# Patient Record
Sex: Female | Born: 1955 | Race: White | Hispanic: No | Marital: Married | Smoking: Never smoker
Health system: Southern US, Community
[De-identification: ages and names within clinical notes are randomized; demographics above are authoritative.]

## PROBLEM LIST (undated history)

## (undated) DIAGNOSIS — C801 Malignant (primary) neoplasm, unspecified: Secondary | ICD-10-CM

## (undated) DIAGNOSIS — C50919 Malignant neoplasm of unspecified site of unspecified female breast: Secondary | ICD-10-CM

---

## 2000-03-13 DIAGNOSIS — Z9221 Personal history of antineoplastic chemotherapy: Secondary | ICD-10-CM

## 2000-03-13 DIAGNOSIS — C50919 Malignant neoplasm of unspecified site of unspecified female breast: Secondary | ICD-10-CM

## 2000-03-13 HISTORY — DX: Malignant neoplasm of unspecified site of unspecified female breast: C50.919

## 2000-03-13 HISTORY — DX: Personal history of antineoplastic chemotherapy: Z92.21

## 2000-12-11 HISTORY — PX: BREAST BIOPSY: SHX20

## 2001-03-13 DIAGNOSIS — Z923 Personal history of irradiation: Secondary | ICD-10-CM

## 2001-03-13 HISTORY — DX: Personal history of irradiation: Z92.3

## 2001-07-09 HISTORY — PX: BREAST EXCISIONAL BIOPSY: SUR124

## 2004-09-19 ENCOUNTER — Ambulatory Visit: Payer: Self-pay | Admitting: Oncology

## 2004-11-16 ENCOUNTER — Ambulatory Visit: Payer: Self-pay | Admitting: General Surgery

## 2005-11-20 ENCOUNTER — Ambulatory Visit: Payer: Self-pay | Admitting: General Surgery

## 2006-11-21 ENCOUNTER — Ambulatory Visit: Payer: Self-pay | Admitting: General Surgery

## 2007-11-20 ENCOUNTER — Ambulatory Visit: Payer: Self-pay | Admitting: General Surgery

## 2008-11-24 ENCOUNTER — Ambulatory Visit: Payer: Self-pay | Admitting: General Surgery

## 2009-11-25 ENCOUNTER — Ambulatory Visit: Payer: Self-pay | Admitting: General Surgery

## 2010-11-29 ENCOUNTER — Ambulatory Visit: Payer: Self-pay | Admitting: General Surgery

## 2011-03-13 ENCOUNTER — Ambulatory Visit: Payer: Self-pay | Admitting: General Surgery

## 2011-12-04 ENCOUNTER — Ambulatory Visit: Payer: Self-pay | Admitting: Family Medicine

## 2012-12-25 ENCOUNTER — Ambulatory Visit: Payer: Self-pay | Admitting: Family Medicine

## 2013-12-30 ENCOUNTER — Ambulatory Visit: Payer: Self-pay | Admitting: Family Medicine

## 2014-10-06 ENCOUNTER — Other Ambulatory Visit: Payer: Self-pay | Admitting: Obstetrics and Gynecology

## 2014-10-06 DIAGNOSIS — Z13820 Encounter for screening for osteoporosis: Secondary | ICD-10-CM

## 2014-10-06 DIAGNOSIS — Z1231 Encounter for screening mammogram for malignant neoplasm of breast: Secondary | ICD-10-CM

## 2015-01-04 ENCOUNTER — Ambulatory Visit
Admission: RE | Admit: 2015-01-04 | Discharge: 2015-01-04 | Disposition: A | Discharge status: Home / Self Care | Payer: PRIVATE HEALTH INSURANCE | Source: Ambulatory Visit | Attending: Obstetrics and Gynecology | Admitting: Obstetrics and Gynecology

## 2015-01-04 ENCOUNTER — Ambulatory Visit: Payer: Self-pay

## 2015-01-04 DIAGNOSIS — Z13820 Encounter for screening for osteoporosis: Secondary | ICD-10-CM | POA: Diagnosis present

## 2015-01-04 DIAGNOSIS — M8588 Other specified disorders of bone density and structure, other site: Secondary | ICD-10-CM | POA: Insufficient documentation

## 2015-01-04 DIAGNOSIS — M85852 Other specified disorders of bone density and structure, left thigh: Secondary | ICD-10-CM | POA: Diagnosis not present

## 2015-01-04 DIAGNOSIS — Z1231 Encounter for screening mammogram for malignant neoplasm of breast: Secondary | ICD-10-CM | POA: Insufficient documentation

## 2015-01-08 ENCOUNTER — Encounter: Payer: Self-pay | Admitting: Family

## 2015-01-08 ENCOUNTER — Ambulatory Visit: Payer: Self-pay | Admitting: Family

## 2015-01-08 VITALS — BP 110/60 | HR 82 | Temp 99.0°F

## 2015-01-08 DIAGNOSIS — J069 Acute upper respiratory infection, unspecified: Secondary | ICD-10-CM

## 2015-01-08 DIAGNOSIS — Z853 Personal history of malignant neoplasm of breast: Secondary | ICD-10-CM

## 2015-01-08 MED ORDER — AZITHROMYCIN 250 MG PO TABS: ORAL_TABLET | ORAL | Status: DC

## 2015-01-08 NOTE — Progress Notes (Signed)
S/ 2 day h/o cold sxs , generalised, low grade temp  Denied gi gu sx's  O/ low grade temp noted mildly ill ENT  Viral changes Neck supple heart rsr lungs clear  A/ URI P / viral course discussed and supportive measures encouraged.  Zpack to have on hand if sxs persist or localise.

## 2015-02-19 ENCOUNTER — Ambulatory Visit: Payer: PRIVATE HEALTH INSURANCE | Admitting: Anesthesiology

## 2015-02-19 ENCOUNTER — Encounter: Payer: Self-pay | Admitting: *Deleted

## 2015-02-19 ENCOUNTER — Ambulatory Visit
Admission: RE | Admit: 2015-02-19 | Discharge: 2015-02-19 | Disposition: A | Discharge status: Home / Self Care | Payer: PRIVATE HEALTH INSURANCE | Source: Ambulatory Visit | Attending: Gastroenterology | Admitting: Gastroenterology

## 2015-02-19 ENCOUNTER — Encounter
Admission: RE | Disposition: A | Discharge status: Home / Self Care | Payer: Self-pay | Source: Ambulatory Visit | Attending: Gastroenterology

## 2015-02-19 DIAGNOSIS — Z8371 Family history of colonic polyps: Secondary | ICD-10-CM | POA: Insufficient documentation

## 2015-02-19 DIAGNOSIS — Z9221 Personal history of antineoplastic chemotherapy: Secondary | ICD-10-CM | POA: Insufficient documentation

## 2015-02-19 DIAGNOSIS — Z1211 Encounter for screening for malignant neoplasm of colon: Secondary | ICD-10-CM | POA: Insufficient documentation

## 2015-02-19 DIAGNOSIS — Z923 Personal history of irradiation: Secondary | ICD-10-CM | POA: Diagnosis not present

## 2015-02-19 DIAGNOSIS — Z853 Personal history of malignant neoplasm of breast: Secondary | ICD-10-CM | POA: Diagnosis not present

## 2015-02-19 DIAGNOSIS — Z803 Family history of malignant neoplasm of breast: Secondary | ICD-10-CM | POA: Diagnosis not present

## 2015-02-19 HISTORY — PX: COLONOSCOPY WITH PROPOFOL: SHX5780

## 2015-02-19 HISTORY — DX: Malignant (primary) neoplasm, unspecified: C80.1

## 2015-02-19 SURGERY — COLONOSCOPY WITH PROPOFOL
Anesthesia: General

## 2015-02-19 MED ORDER — EPHEDRINE SULFATE 50 MG/ML IJ SOLN
INTRAMUSCULAR | Status: DC | PRN
  Administered 2015-02-19: 10 mg via INTRAVENOUS

## 2015-02-19 MED ORDER — LIDOCAINE HCL (CARDIAC) 20 MG/ML IV SOLN
INTRAVENOUS | Status: DC | PRN
  Administered 2015-02-19: 80 mg via INTRAVENOUS

## 2015-02-19 MED ORDER — PROPOFOL 500 MG/50ML IV EMUL
INTRAVENOUS | Status: DC | PRN
  Administered 2015-02-19: 160 ug/kg/min via INTRAVENOUS

## 2015-02-19 MED ORDER — FENTANYL CITRATE (PF) 100 MCG/2ML IJ SOLN
INTRAMUSCULAR | Status: DC | PRN
  Administered 2015-02-19: 50 ug via INTRAVENOUS

## 2015-02-19 MED ORDER — SODIUM CHLORIDE 0.9 % IV SOLN: INTRAVENOUS | Status: DC

## 2015-02-19 MED ORDER — GLYCOPYRROLATE 0.2 MG/ML IJ SOLN
INTRAMUSCULAR | Status: DC | PRN
Start: 2015-02-19 — End: 2015-02-19
  Administered 2015-02-19: 0.2 mg via INTRAVENOUS

## 2015-02-19 MED ORDER — SODIUM CHLORIDE 0.9 % IV SOLN
INTRAVENOUS | Status: DC
  Administered 2015-02-19: 1000 mL via INTRAVENOUS

## 2015-02-19 NOTE — Transfer of Care (Signed)
Immediate Anesthesia Transfer of Care Note  Patient: Claire Moore  Procedure(s) Performed: Procedure(s): COLONOSCOPY WITH PROPOFOL (N/A)  Patient Location: PACU and Endoscopy Unit  Anesthesia Type:General  Level of Consciousness: sedated  Airway & Oxygen Therapy: Patient Spontanous Breathing and Patient connected to nasal cannula oxygen  Post-op Assessment: Report given to RN and Post -op Vital signs reviewed and stable  Post vital signs: Reviewed and stable  Last Vitals: 08:43 105/53 10 resp 96.8 temp 100% sat 83 hr  Filed Vitals:   02/19/15 0804  BP: 128/83  Pulse: 66  Temp: 36.5 C  Resp: 16    Complications: No apparent anesthesia complications

## 2015-02-19 NOTE — Op Note (Signed)
[facility] Gastroenterology Patient Name: Claire Moore Procedure Date: 02/19/2015 8:25 AM MRN: [id number] Account #: [id number] Date of Birth: [birth year] Admit Type: Outpatient Age: 59 Room: [facility] ENDO ROOM 4 Gender: Female Note Status: Finalized Procedure:         Colonoscopy Indications:       Screening for colorectal malignant neoplasm, Colon cancer                     screening in patient at increased risk: Family history of                     1st-degree relative with colon polyps Providers:         Lupita Dawn. Candace Cruise, MD Referring MD:      Kincius C. Copland Jaclyn Prime, MD (Referring MD) Medicines:         Monitored Anesthesia Care Complications:     No immediate complications. Procedure:         Pre-Anesthesia Assessment:                    - Prior to the procedure, a History and Physical was                     performed, and patient medications, allergies and                     sensitivities were reviewed. The patient's tolerance of                     previous anesthesia was reviewed.                    - The risks and benefits of the procedure and the sedation                     options and risks were discussed with the patient. All                     questions were answered and informed consent was obtained.                    - After reviewing the risks and benefits, the patient was                     deemed in satisfactory condition to undergo the procedure.                    After obtaining informed consent, the colonoscope was                     passed under direct vision. Throughout the procedure, the                     patient's blood pressure, pulse, and oxygen saturations                     were monitored continuously. The Colonoscope was                     introduced through the anus and advanced to the the cecum,                     identified by appendiceal orifice and ileocecal valve. The  colonoscopy was performed without  difficulty. The patient                     tolerated the procedure well. The quality of the bowel                     preparation was good. Findings:      The colon (entire examined portion) appeared normal. Impression:        - The entire examined colon is normal.                    - No specimens collected. Recommendation:    - Discharge patient to home.                    - Repeat colonoscopy in 5 years for surveillance.                    - The findings and recommendations were discussed with the                     patient. Procedure Code(s): --- Professional ---                    [id number], Colonoscopy, flexible; diagnostic, including                     collection of specimen(s) by brushing or washing, when                     performed (separate procedure) Diagnosis Code(s): --- Professional ---                    Z12.11, Encounter for screening for malignant neoplasm of                     colon                    Z83.71, Family history of colonic polyps CPT copyright 2014 American Medical Association. All rights reserved. The codes documented in this report are preliminary and upon coder review may  be revised to meet current compliance requirements. Hulen Luster, MD 02/19/2015 8:42:13 AM This report has been signed electronically. Number of Addenda: 0 Note Initiated On: 02/19/2015 8:25 AM Scope Withdrawal Time: 0 hours 4 minutes 56 seconds  Total Procedure Duration: 0 hours 9 minutes 16 seconds       [facility]

## 2015-02-19 NOTE — Anesthesia Preprocedure Evaluation (Signed)
Anesthesia Evaluation  Patient identified by MRN, date of birth, ID band Patient awake    Reviewed: Allergy & Precautions, H&P , NPO status , Patient's Chart, lab work & pertinent test results  Airway Mallampati: III  TM Distance: >3 FB Neck ROM: full    Dental no notable dental hx. (+) Teeth Intact   Pulmonary neg pulmonary ROS, neg shortness of breath,    Pulmonary exam normal breath sounds clear to auscultation       Cardiovascular Exercise Tolerance: Good (-) Past MI negative cardio ROS Normal cardiovascular exam Rhythm:regular Rate:Normal     Neuro/Psych negative neurological ROS  negative psych ROS   GI/Hepatic negative GI ROS, Neg liver ROS, neg GERD  ,  Endo/Other  negative endocrine ROS  Renal/GU negative Renal ROS  negative genitourinary   Musculoskeletal   Abdominal   Peds  Hematology negative hematology ROS (+)   Anesthesia Other Findings Past Medical History:   Cancer [facility])                                                Past Surgical History:   BREAST BIOPSY                                   Left 12/11/2000      Comment:+   BREAST EXCISIONAL BIOPSY                        Left 07/09/2001      Comment:+ chemo and rad  BMI    Body Mass Index   22.68 kg/m 2    Signs and symptoms suggestive of sleep apnea    Reproductive/Obstetrics negative OB ROS                             Anesthesia Physical Anesthesia Plan  ASA: II  Anesthesia Plan: General   Post-op Pain Management:    Induction:   Airway Management Planned:   Additional Equipment:   Intra-op Plan:   Post-operative Plan:   Informed Consent: I have reviewed the patients History and Physical, chart, labs and discussed the procedure including the risks, benefits and alternatives for the proposed anesthesia with the patient or authorized representative who has indicated his/her understanding and acceptance.    Dental Advisory Given  Plan Discussed with: Anesthesiologist, CRNA and Surgeon  Anesthesia Plan Comments:         Anesthesia Quick Evaluation

## 2015-02-19 NOTE — Anesthesia Postprocedure Evaluation (Signed)
Anesthesia Post Note  Patient: Quillian Quince  Procedure(s) Performed: Procedure(s) (LRB): COLONOSCOPY WITH PROPOFOL (N/A)  Patient location during evaluation: Endoscopy Anesthesia Type: General Level of consciousness: awake and alert Pain management: pain level controlled Vital Signs Assessment: post-procedure vital signs reviewed and stable Respiratory status: spontaneous breathing, nonlabored ventilation, respiratory function stable and patient connected to nasal cannula oxygen Cardiovascular status: blood pressure returned to baseline and stable Postop Assessment: no signs of nausea or vomiting Anesthetic complications: no    Last Vitals:  Filed Vitals:   02/19/15 0900 02/19/15 0910  BP: 122/79 125/74  Pulse: 83 79  Temp:    Resp: 13 11    Last Pain: There were no vitals filed for this visit.               Precious Haws Jesua Tamblyn

## 2015-02-19 NOTE — H&P (Signed)
    Primary Care Physician:  Pcp Not In System Primary Gastroenterologist:  Dr. Candace Cruise  Pre-Procedure History & Physical: HPI:  Claire Moore is a 59 y.o. female is here for an colonoscopy.   Past Medical History  Diagnosis Date  . Cancer [facility])     Past Surgical History  Procedure Laterality Date  . Breast biopsy Left 12/11/2000    +  . Breast excisional biopsy Left 07/09/2001    + chemo and rad    Prior to Admission medications   Medication Sig Start Date End Date Taking? Authorizing Provider  azithromycin (ZITHROMAX Z-PAK) 250 MG tablet As directed Patient not taking: Reported on 02/19/2015 01/08/15   Tommie Homero Fellers, FNP    Allergies as of 02/09/2015  . (No Known Allergies)    Family History  Problem Relation Age of Onset  . Breast cancer Sister 50  . Breast cancer Maternal Aunt     Social History   Social History  . Marital Status: Married    Spouse Name: N/A  . Number of Children: N/A  . Years of Education: N/A   Occupational History  . Not on file.   Social History Main Topics  . Smoking status: Unknown If Ever Smoked  . Smokeless tobacco: Not on file  . Alcohol Use: Not on file  . Drug Use: Not on file  . Sexual Activity: Not on file   Other Topics Concern  . Not on file   Social History Narrative    Review of Systems: See HPI, otherwise negative ROS  Physical Exam: BP 128/83 mmHg  Pulse 66  Temp(Src) 97.7 F (36.5 C) (Tympanic)  Resp 16  Ht 5\' 1"  (1.549 m)  Wt 54.432 kg (120 lb)  BMI 22.69 kg/m2  SpO2 100% General:   Alert,  pleasant and cooperative in NAD Head:  Normocephalic and atraumatic. Neck:  Supple; no masses or thyromegaly. Lungs:  Clear throughout to auscultation.    Heart:  Regular rate and rhythm. Abdomen:  Soft, nontender and nondistended. Normal bowel sounds, without guarding, and without rebound.   Neurologic:  Alert and  oriented x4;  grossly normal neurologically.  Impression/Plan: Quillian Quince is here for an  colonoscopy to be performed for screening and family hx of colon polyps  Risks, benefits, limitations, and alternatives regarding  colonoscopy have been reviewed with the patient.  Questions have been answered.  All parties agreeable.   Jacklyn Branan, Lupita Dawn, MD  02/19/2015, 8:21 AM

## 2015-02-20 ENCOUNTER — Encounter: Payer: Self-pay | Admitting: Gastroenterology

## 2015-12-07 ENCOUNTER — Other Ambulatory Visit: Payer: Self-pay | Admitting: Obstetrics and Gynecology

## 2015-12-07 DIAGNOSIS — Z1231 Encounter for screening mammogram for malignant neoplasm of breast: Secondary | ICD-10-CM

## 2016-01-11 ENCOUNTER — Ambulatory Visit
Admission: RE | Admit: 2016-01-11 | Discharge: 2016-01-11 | Disposition: A | Discharge status: Home / Self Care | Payer: Managed Care, Other (non HMO) | Source: Ambulatory Visit | Attending: Obstetrics and Gynecology | Admitting: Obstetrics and Gynecology

## 2016-01-11 DIAGNOSIS — Z1231 Encounter for screening mammogram for malignant neoplasm of breast: Secondary | ICD-10-CM | POA: Insufficient documentation

## 2016-01-11 HISTORY — DX: Malignant neoplasm of unspecified site of unspecified female breast: C50.919

## 2016-06-05 ENCOUNTER — Ambulatory Visit: Payer: Self-pay | Admitting: Family

## 2016-06-05 VITALS — BP 135/69 | HR 69 | Temp 97.8°F

## 2016-06-05 DIAGNOSIS — B354 Tinea corporis: Secondary | ICD-10-CM

## 2016-06-05 NOTE — Progress Notes (Signed)
135/69  

## 2016-06-05 NOTE — Progress Notes (Signed)
S/ mildly pruritic lesion noted left shoulder  A few days ago . Around farm animals , cat and dog/. Started lotriman after her google search.   O/ single circumscribed tan macular  lesion with  Darker border; approx .5cm left shoulder  A/ tinea corp P continue otc lotriman as directed x 2 weeks, f/u prn not improving.

## 2016-11-28 ENCOUNTER — Encounter: Payer: Self-pay | Admitting: Obstetrics and Gynecology

## 2016-11-28 ENCOUNTER — Ambulatory Visit (INDEPENDENT_AMBULATORY_CARE_PROVIDER_SITE_OTHER): Payer: Managed Care, Other (non HMO) | Admitting: Obstetrics and Gynecology

## 2016-11-28 VITALS — BP 138/90 | HR 62 | Ht 61.0 in | Wt 125.0 lb

## 2016-11-28 DIAGNOSIS — Z1239 Encounter for other screening for malignant neoplasm of breast: Secondary | ICD-10-CM

## 2016-11-28 DIAGNOSIS — Z01419 Encounter for gynecological examination (general) (routine) without abnormal findings: Secondary | ICD-10-CM

## 2016-11-28 DIAGNOSIS — Z803 Family history of malignant neoplasm of breast: Secondary | ICD-10-CM

## 2016-11-28 DIAGNOSIS — Z1231 Encounter for screening mammogram for malignant neoplasm of breast: Secondary | ICD-10-CM

## 2016-11-28 DIAGNOSIS — Z0000 Encounter for general adult medical examination without abnormal findings: Secondary | ICD-10-CM

## 2016-11-28 DIAGNOSIS — R5382 Chronic fatigue, unspecified: Secondary | ICD-10-CM

## 2016-11-28 DIAGNOSIS — R030 Elevated blood-pressure reading, without diagnosis of hypertension: Secondary | ICD-10-CM | POA: Diagnosis not present

## 2016-11-28 NOTE — Progress Notes (Addendum)
PCP: System, Pcp Not In   Chief Complaint  Patient presents with  . Gynecologic Exam    HPI:      Claire Moore is a 61 y.o. G0P0000 who LMP was No LMP recorded. Patient is postmenopausal., presents today for her annual examination.  Her menses are absent due to menopause.  She does not have intermenstrual bleeding.  She does have occas night sweats.  Sex activity: single partner, contraception - post menopausal status. She does not have vaginal dryness.  Last Pap: October 06, 2014  Results were: no abnormalities /neg HPV DNA.  Hx of STDs: none  Last mammogram: January 11, 2016  Results were: normal--routine follow-up in 12 months. Pt is s/p lobular carcinoma at age 16, with lumpectomy, chemo and rad, and 5 yrs tamoxifen tx. She is no longer being followed.  There is a FH of breast cancer in her sister and mat aunt. Her sister is BRCA neg. Pt declined MyRisk testing last yr. There is no FH of ovarian cancer. The patient does do self-breast exams.  Colonoscopy: colonoscopy 2 years ago without abnormalities. . Repeat due after 5  years.  DEXA: was screened for osteoporis in 2016 with osteopenia in spine and hip.   Tobacco use: The patient denies current or previous tobacco use. Alcohol use: none Exercise: moderately active  She does get adequate calcium and Vitamin D in her diet.  She complains of fatigue. She sleeps about 7 hrs nightly but is very active taking care of both parents in declining health. She awakes at night because she is hot but then falls back to sleep. Pt is exercising. She had a normal lipid panel 2016. She is taking Vit D supp after being deficient 2016.   Past Medical History:  Diagnosis Date  . Breast cancer ([place]) 2002   left with chemo and rad tx  . Cancer [facility])     Past Surgical History:  Procedure Laterality Date  . BREAST BIOPSY Left 12/11/2000   +  . BREAST EXCISIONAL BIOPSY Left 07/09/2001   + chemo and rad  . COLONOSCOPY WITH PROPOFOL N/A  02/19/2015   Procedure: COLONOSCOPY WITH PROPOFOL;  Surgeon: Hulen Luster, MD;  Location[facility] ENDOSCOPY;  Service: Gastroenterology;  Laterality: N/A;    Family History  Problem Relation Age of Onset  . Breast cancer Sister 31       BRCA neg  . Hypertension Sister   . Breast cancer Maternal Aunt 29  . Hypertension Mother   . Parkinson's disease Mother     Social History   Social History  . Marital status: Married    Spouse name: N/A  . Number of children: N/A  . Years of education: N/A   Occupational History  . Not on file.   Social History Main Topics  . Smoking status: Never Smoker  . Smokeless tobacco: Never Used  . Alcohol use No  . Drug use: No  . Sexual activity: Yes    Partners: Male    Birth control/ protection: None   Other Topics Concern  . Not on file   Social History Narrative  . No narrative on file    Current Meds  Medication Sig  . cetirizine (ZYRTEC) 10 MG chewable tablet Chew 10 mg by mouth daily.      ROS:  Review of Systems  Constitutional: Positive for fatigue. Negative for fever and unexpected weight change.  Respiratory: Negative for cough, shortness of breath and wheezing.  Cardiovascular: Negative for chest pain, palpitations and leg swelling.  Gastrointestinal: Negative for blood in stool, constipation, diarrhea, nausea and vomiting.  Endocrine: Negative for cold intolerance, heat intolerance and polyuria.  Genitourinary: Negative for dyspareunia, dysuria, flank pain, frequency, genital sores, hematuria, menstrual problem, pelvic pain, urgency, vaginal bleeding, vaginal discharge and vaginal pain.  Musculoskeletal: Negative for back pain, joint swelling and myalgias.  Skin: Negative for rash.  Neurological: Negative for dizziness, syncope, light-headedness, numbness and headaches.  Hematological: Negative for adenopathy.  Psychiatric/Behavioral: Negative for agitation, confusion, sleep disturbance and suicidal ideas. The patient is  not nervous/anxious.      Objective: BP 138/90 (BP Location: Left Arm, Patient Position: Sitting, Cuff Size: Normal)   Pulse 62   Ht '5\' 1"'  (1.549 m)   Wt 125 lb (56.7 kg)   BMI 23.62 kg/m    Physical Exam  Constitutional: She is oriented to person, place, and time. She appears well-developed and well-nourished.  Genitourinary: Vagina normal and uterus normal. There is no rash or tenderness on the right labia. There is no rash or tenderness on the left labia. No erythema or tenderness in the vagina. No vaginal discharge found. Right adnexum does not display mass and does not display tenderness. Left adnexum does not display mass and does not display tenderness. Cervix does not exhibit motion tenderness or polyp. Uterus is not enlarged or tender.  Neck: Normal range of motion. No thyromegaly present.  Cardiovascular: Normal rate, regular rhythm and normal heart sounds.   No murmur heard. Pulmonary/Chest: Effort normal and breath sounds normal. Right breast exhibits no mass, no nipple discharge, no skin change and no tenderness. Left breast exhibits no mass, no nipple discharge, no skin change and no tenderness.  Abdominal: Soft. There is no tenderness. There is no guarding.  Musculoskeletal: Normal range of motion.  Neurological: She is alert and oriented to person, place, and time. No cranial nerve deficit.  Psychiatric: She has a normal mood and affect. Her behavior is normal.  Vitals reviewed.   Assessment/Plan:  Encounter for annual routine gynecological examination  Screening for breast cancer - Pt to sched mammo. - Plan: MM SCREENING BREAST TOMO BILATERAL  Family history of breast cancer - And personal hx of breast cancer. MyRisk testing discussed and pt declines. Cont Vit D supp. - Plan: MM SCREENING BREAST TOMO BILATERAL  Chronic fatigue - Check labs. Will f/u with results. If neg, increase sleep due to being so active. - Plan: Comprehensive metabolic panel, TSH + free T4,  CBC, Comprehensive metabolic panel, TSH + free T4, CBC  Blood tests for routine general physical examination - Plan: Comprehensive metabolic panel, TSH + free T4, CBC, Comprehensive metabolic panel, TSH + free T4, CBC  Elevated blood pressure reading - Pt to check at home. F/u if cont to be elevated.    Meds ordered this encounter  Medications  . cetirizine (ZYRTEC) 10 MG chewable tablet    Sig: Chew 10 mg by mouth daily.            GYN counsel breast self exam, mammography screening, adequate intake of calcium and vitamin D, diet and exercise    F/U  Return in about 1 year (around 11/28/2017).  Vonne Mcdanel B. Fernanda Twaddell, PA-C 11/29/2016 8:40 AM

## 2016-12-04 IMAGING — MG 2D DIGITAL SCREENING BILATERAL MAMMOGRAM WITH CAD AND ADJUNCT TO
9 of 12 series · 9 of 28 positions shown · non-contrast
Comparison: Previous exam(s).

CLINICAL DATA: Screening. History of treated left breast cancer,
status post lumpectomy, chemo and radiation therapy in 5991.

EXAM:
2D DIGITAL SCREENING BILATERAL MAMMOGRAM WITH CAD AND ADJUNCT TOMO

[R CC]
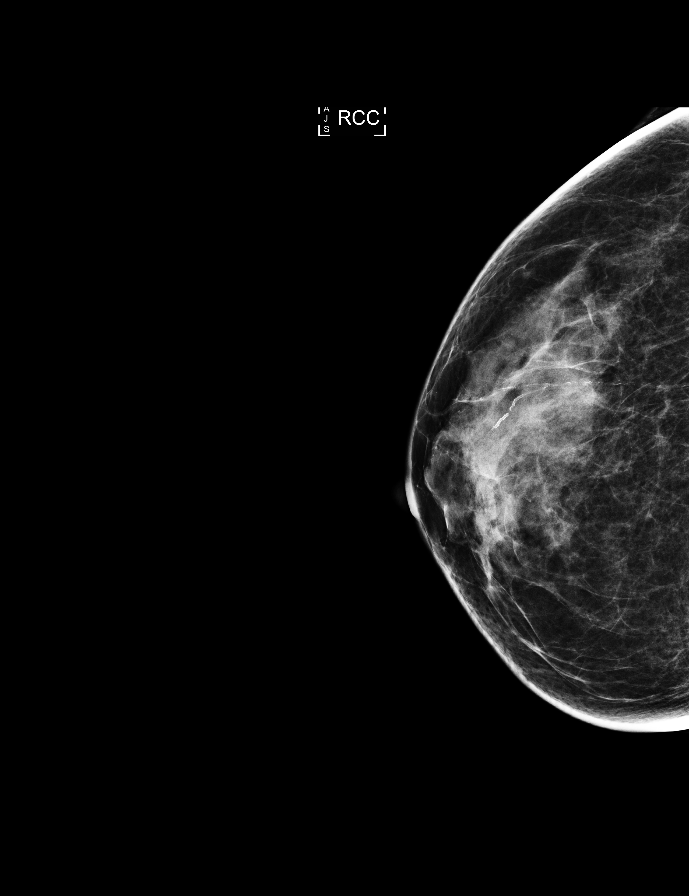

[R CC synth-2D]
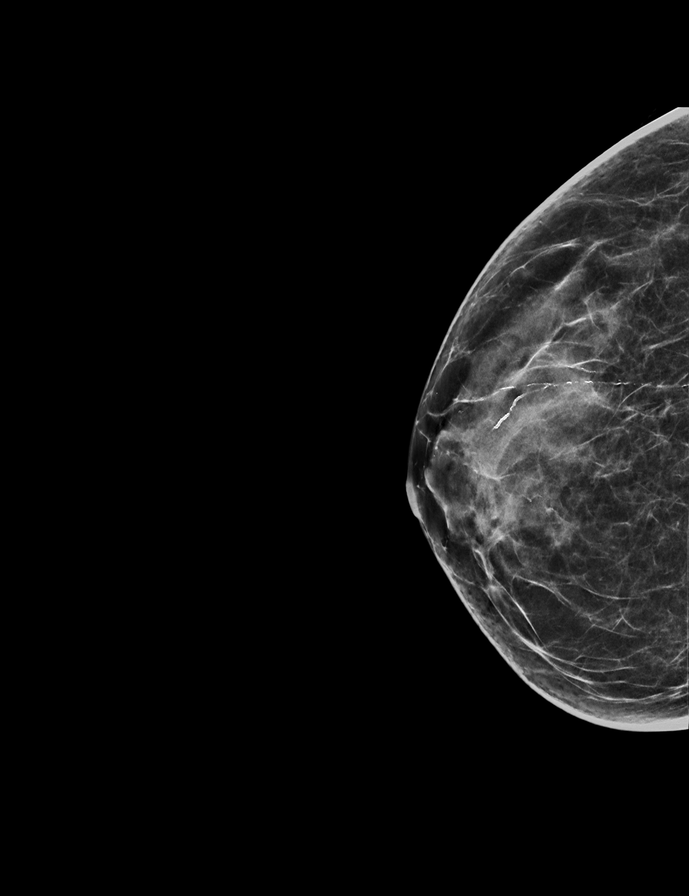

[L CC synth-2D]
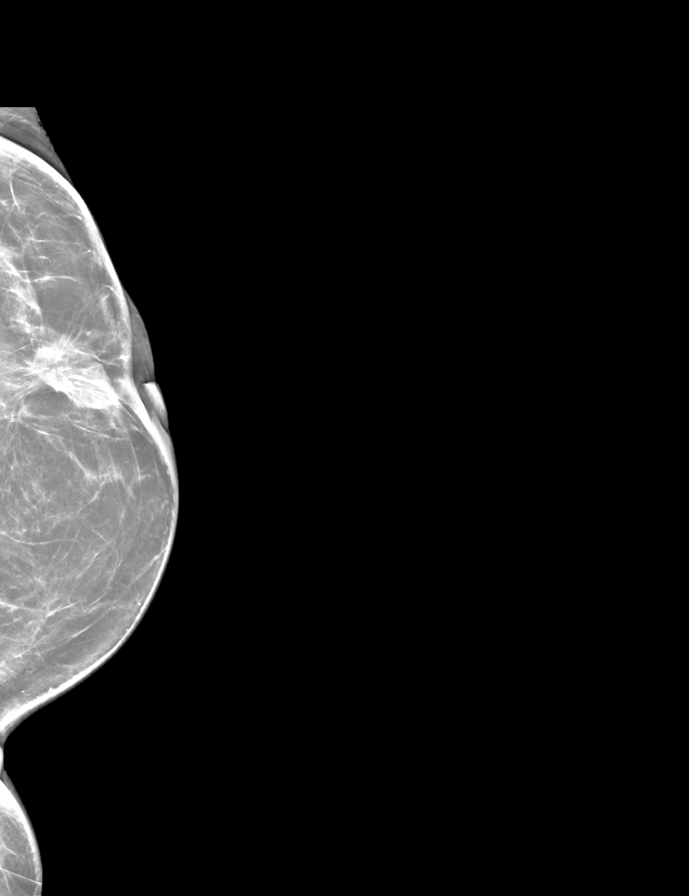

[L MLO]
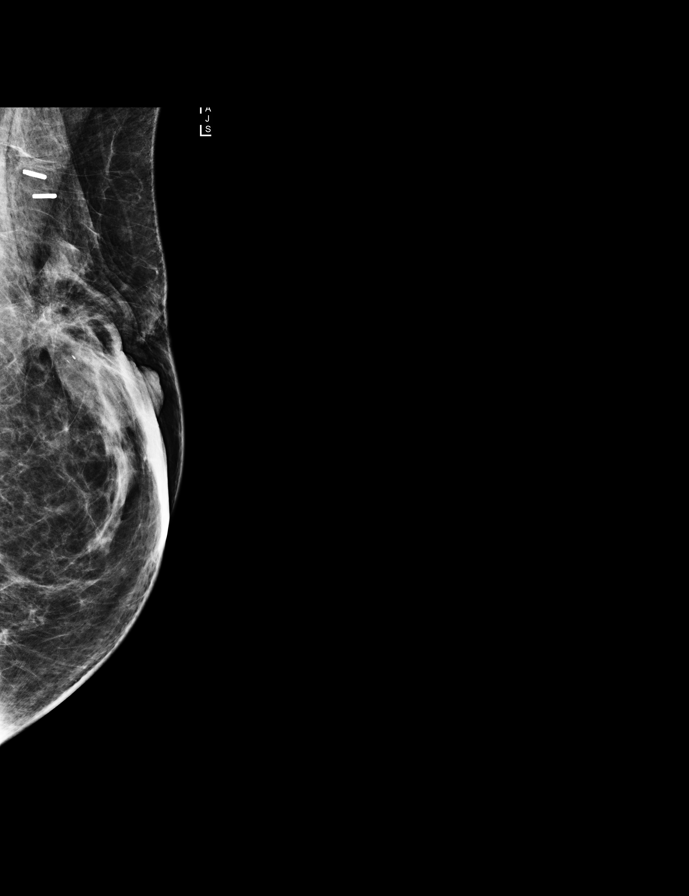

[L MLO synth-2D]
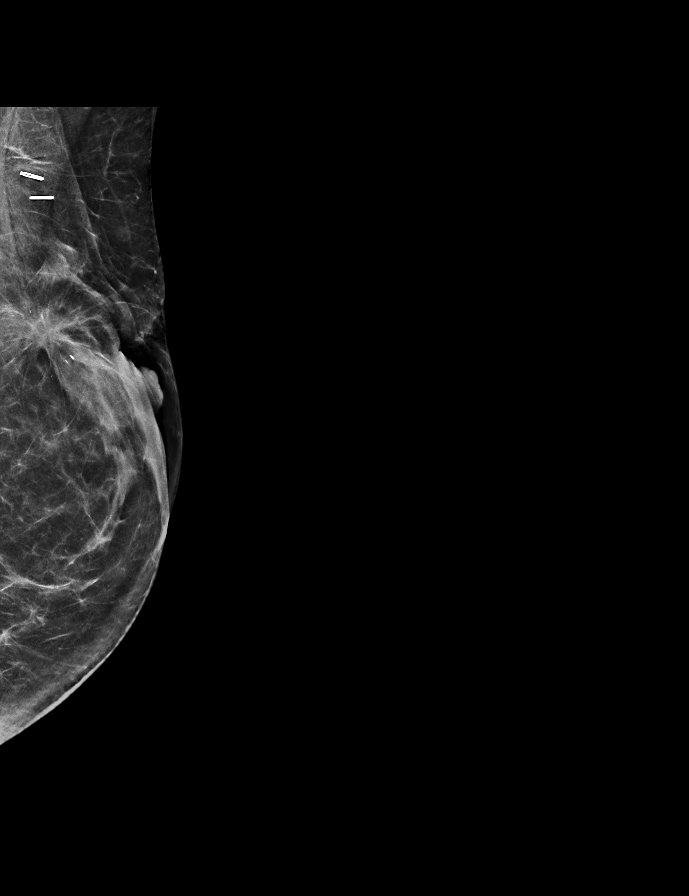

[L CC]
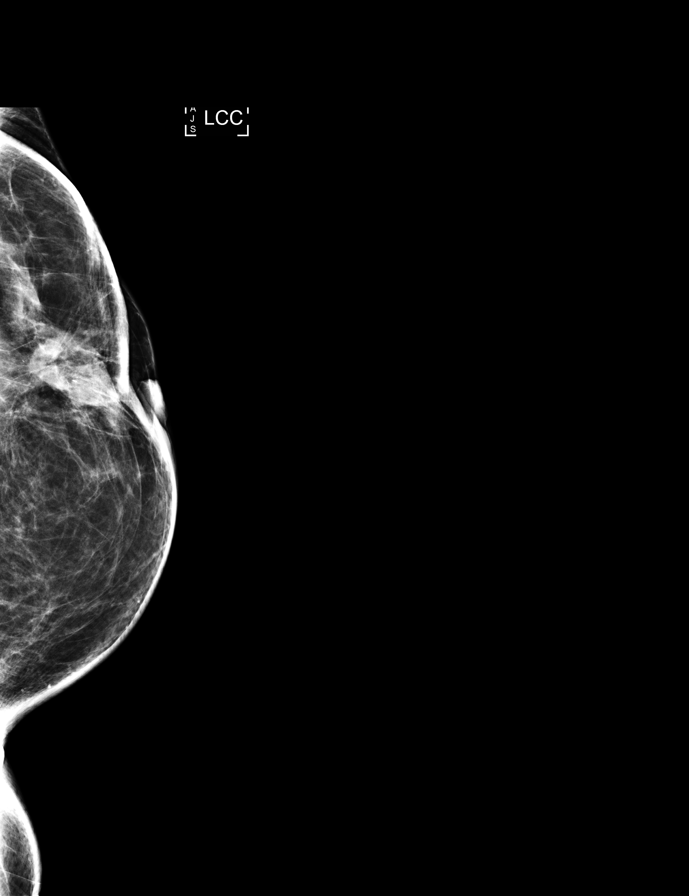

[R MLO synth-2D]
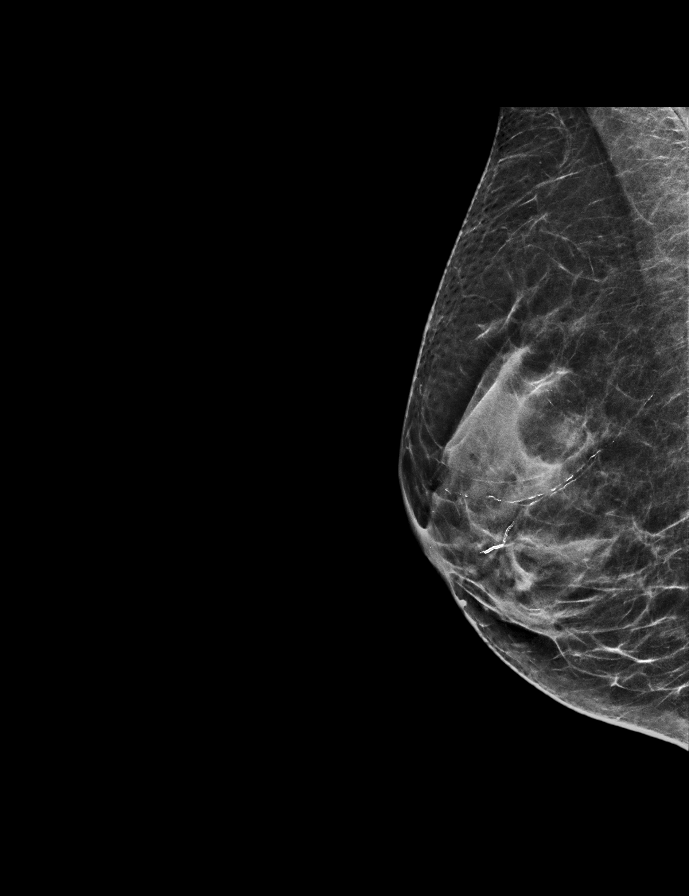

[R MLO]
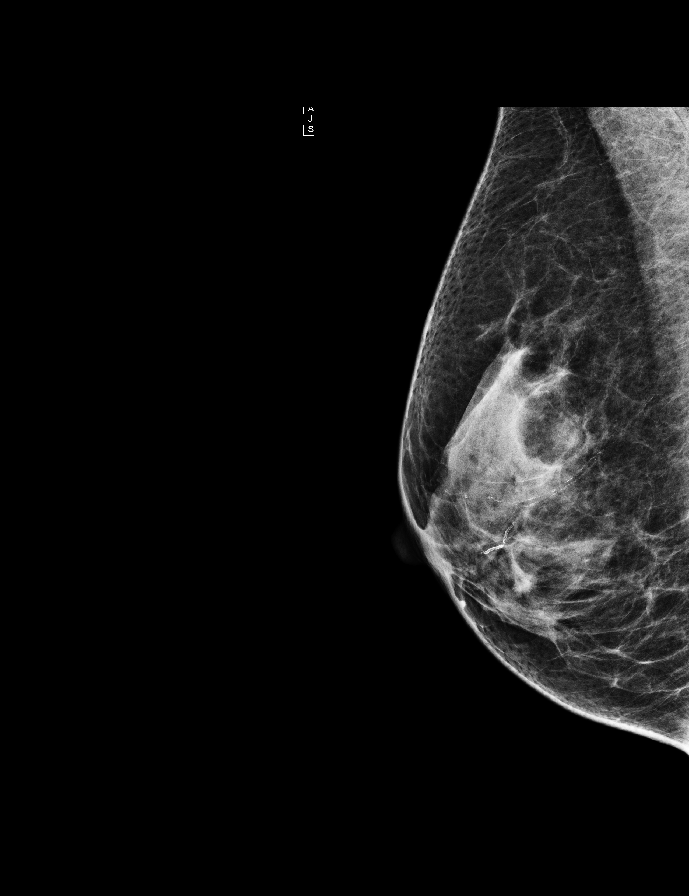

[L MLO tomo · tomo slice 28/55.0]
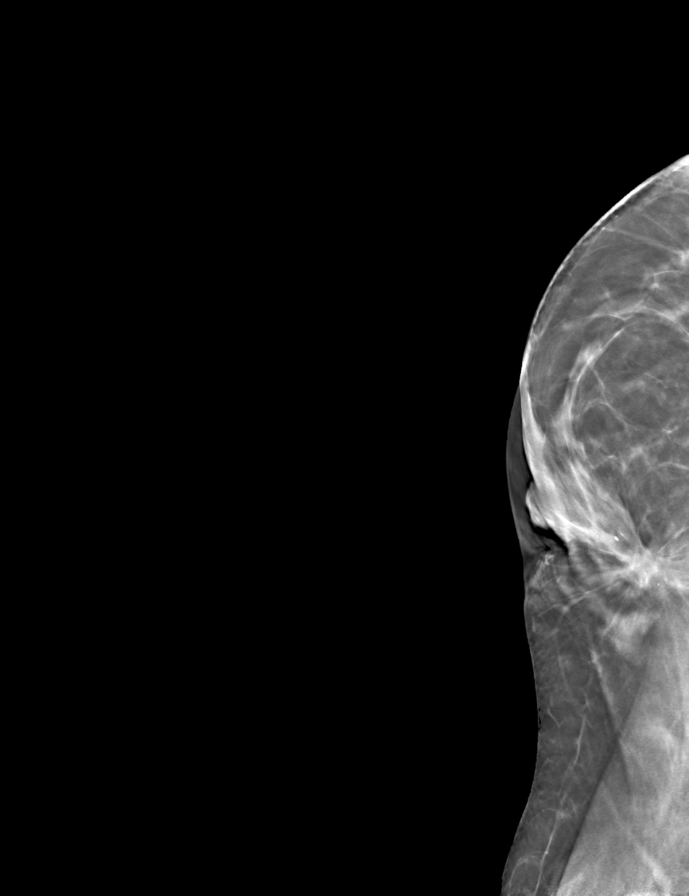

[9 of 28 positions shown; findings below may reference images not displayed]

ACR Breast Density Category c: The breast tissue is heterogeneously
dense, which may obscure small masses.
FINDINGS: There are no findings suspicious for malignancy. Stable
posttreatment changes in the left breast. Images were processed with
CAD.
IMPRESSION: No mammographic evidence of malignancy. A result letter of this
screening mammogram will be mailed directly to the patient.

RECOMMENDATION:
Screening mammogram in one year. (Code:PT-9-995)

BI-RADS CATEGORY  1: Negative.

## 2016-12-20 ENCOUNTER — Ambulatory Visit: Payer: Self-pay | Admitting: Physician Assistant

## 2016-12-20 DIAGNOSIS — Z299 Encounter for prophylactic measures, unspecified: Secondary | ICD-10-CM

## 2016-12-20 NOTE — Progress Notes (Signed)
Patient came in to have blood drawn for testing per Elmo Putt Copland's orders.

## 2016-12-21 LAB — COMPREHENSIVE METABOLIC PANEL
A/G RATIO: 1.7 (ref 1.2–2.2)
ALBUMIN: 4.5 g/dL (ref 3.6–4.8)
ALK PHOS: 90 IU/L (ref 39–117)
ALT: 24 IU/L (ref 0–32)
AST: 23 IU/L (ref 0–40)
BILIRUBIN TOTAL: 0.4 mg/dL (ref 0.0–1.2)
BUN/Creatinine Ratio: 18 (ref 12–28)
BUN: 12 mg/dL (ref 8–27)
CHLORIDE: 98 mmol/L (ref 96–106)
CO2: 23 mmol/L (ref 20–29)
Calcium: 9.5 mg/dL (ref 8.7–10.3)
Creatinine, Ser: 0.66 mg/dL (ref 0.57–1.00)
GFR calc non Af Amer: 94 mL/min/{1.73_m2} (ref >59)
GFR, EST AFRICAN AMERICAN: 109 mL/min/{1.73_m2} (ref >59)
GLOBULIN, TOTAL: 2.7 g/dL (ref 1.5–4.5)
Glucose: 70 mg/dL (ref 65–99)
POTASSIUM: 4 mmol/L (ref 3.5–5.2)
Sodium: 139 mmol/L (ref 134–144)
Total Protein: 7.2 g/dL (ref 6.0–8.5)

## 2016-12-21 LAB — CBC WITH DIFFERENTIAL/PLATELET
BASOS ABS: 0.1 10*3/uL (ref 0.0–0.2)
Basos: 1 %
EOS (ABSOLUTE): 0.2 10*3/uL (ref 0.0–0.4)
Eos: 3 %
HEMATOCRIT: 41.4 % (ref 34.0–46.6)
HEMOGLOBIN: 13.9 g/dL (ref 11.1–15.9)
Immature Grans (Abs): 0 10*3/uL (ref 0.0–0.1)
Immature Granulocytes: 0 %
LYMPHS ABS: 2.1 10*3/uL (ref 0.7–3.1)
Lymphs: 28 %
MCH: 27.4 pg (ref 26.6–33.0)
MCHC: 33.6 g/dL (ref 31.5–35.7)
MCV: 82 fL (ref 79–97)
MONOCYTES: 7 %
Monocytes Absolute: 0.5 10*3/uL (ref 0.1–0.9)
NEUTROS ABS: 4.6 10*3/uL (ref 1.4–7.0)
Neutrophils: 61 %
Platelets: 284 10*3/uL (ref 150–379)
RBC: 5.08 x10E6/uL (ref 3.77–5.28)
RDW: 14.2 % (ref 12.3–15.4)
WBC: 7.5 10*3/uL (ref 3.4–10.8)

## 2016-12-21 LAB — TSH+FREE T4
Free T4: 1.44 ng/dL (ref 0.82–1.77)
TSH: 2.32 u[IU]/mL (ref 0.450–4.500)

## 2016-12-25 ENCOUNTER — Telehealth: Payer: Self-pay | Admitting: Obstetrics and Gynecology

## 2016-12-25 NOTE — Telephone Encounter (Signed)
LM with normal CBC, CMP, and thyroid labs.

## 2017-01-11 ENCOUNTER — Ambulatory Visit
Admission: RE | Admit: 2017-01-11 | Discharge: 2017-01-11 | Disposition: A | Discharge status: Home / Self Care | Payer: Managed Care, Other (non HMO) | Source: Ambulatory Visit | Attending: Obstetrics and Gynecology | Admitting: Obstetrics and Gynecology

## 2017-01-11 DIAGNOSIS — Z1239 Encounter for other screening for malignant neoplasm of breast: Secondary | ICD-10-CM

## 2017-01-11 DIAGNOSIS — Z803 Family history of malignant neoplasm of breast: Secondary | ICD-10-CM | POA: Diagnosis not present

## 2017-01-11 DIAGNOSIS — Z1231 Encounter for screening mammogram for malignant neoplasm of breast: Secondary | ICD-10-CM | POA: Insufficient documentation

## 2017-01-12 ENCOUNTER — Encounter: Payer: Self-pay | Admitting: Obstetrics and Gynecology

## 2017-05-02 ENCOUNTER — Encounter: Payer: Self-pay | Admitting: Family Medicine

## 2017-05-02 ENCOUNTER — Ambulatory Visit: Payer: Self-pay | Admitting: Family Medicine

## 2017-05-02 VITALS — BP 128/78 | HR 90 | Temp 99.3°F | Resp 18

## 2017-05-02 DIAGNOSIS — R05 Cough: Secondary | ICD-10-CM

## 2017-05-02 DIAGNOSIS — R059 Cough, unspecified: Secondary | ICD-10-CM

## 2017-05-02 MED ORDER — DOXYCYCLINE HYCLATE 100 MG PO TABS: 100.0000 mg | ORAL_TABLET | Freq: Two times a day (BID) | ORAL | 0 refills | Status: AC

## 2017-05-02 MED ORDER — BENZONATATE 100 MG PO CAPS: 100.0000 mg | ORAL_CAPSULE | Freq: Two times a day (BID) | ORAL | 0 refills | Status: AC | PRN

## 2017-05-02 NOTE — Progress Notes (Signed)
Subjective:     Claire Moore is a 62 y.o. female here for evaluation of a cough.  The cough is without wheezing, dyspnea or hemoptysis, worsening over time and is aggravated by nothing. Onset of symptoms was 2 weeks ago, gradually worsening since that time. Patient reports that two weeks ago she had symptoms of nasal congestion and cough that was improving but that three days ago she began to develop a low-grade fever and feel much worse. She reports that she has consistently had a low-grade fever for the last five days (known TMAX 100.4 but has been regularly taking tylenol) with a primarily dry cough but with occasional brown sputum production. Associated symptoms include "chest congestion", change in voice, chills and fever.Patient does not have a history of asthma. Patient does not have a history of smoking. Denies sore throat, ear pain, nausea, vomiting, diarrhea, rash, or muscle aches. She reports feeling like the symptoms are worsening every day. Denies chest or back pain, SOB, or wheezing. Treatment at home: tylenol and mucinex DM. Denies significant fatigue.   Review of Systems Pertinent items noted in HPI and remainder of comprehensive ROS otherwise negative.     Objective:    Oxygen saturation 99% on room air BP 128/78   Pulse 90   Temp 99.3 F (37.4 C) (Oral)   Resp 18   SpO2 99%  General: Denies known fever but reports chills. Reports fatigue and malaise. Denies body aches.  HEENT: Left sided facial pressure, nasal congestion, throat itching. Denies ear pain or sore throat. Reports feeling like his ears are clogged. Pain with swallowing.   Lungs: Denies SOB or wheezing. Cough with increased volume and purulence of sputum. Sputum yellow to brown in color.  GI: Denies nausea, vomiting, or diarrhea.  Skin: Denies rash.     Objective:   Physical Exam General: Awake, alert and oriented. No acute distress. Well developed, hydrated and nourished. Appears stated age.  HEENT: No erythema,  edema or exudates of pharynx or tonsils. No erythema or bulging of TM. Mild erythema and edema to nasal turbinates. Maxillary sinuses slightly tender bilaterally. Supple neck without adenopathy. Cardiac: Heart rate and rhythm are normal. No murmurs, gallops, or rubs are auscultated. S1 and S2 are heard and are of normal intensity.  Respiratory: No signs of respiratory distress. Lungs clear. No tachypnea. Able to speak in full sentences without dyspnea. Skin: Skin is warm, dry and intact. Appropriate color for ethnicity. No cyanosis noted.                                                                  Assessment:    Cough : Concerned that patient has a secondary bacterial infection following an acute upper respiratory infection because if this were viral only in origin I would expect her to be feeling better at this point and not worse.     Plan:    Antibiotics per medication orders. Antitussives per medication orders.   Patient instructed to return or f/u with PCP if worsening or no improvement in fever by Monday. Continue OTC medications for symptomatic treatment. Encouraged rest and fluids.

## 2018-01-03 ENCOUNTER — Other Ambulatory Visit: Payer: Self-pay | Admitting: Obstetrics and Gynecology

## 2018-01-03 ENCOUNTER — Other Ambulatory Visit: Payer: Self-pay | Admitting: Family Medicine

## 2018-01-03 DIAGNOSIS — Z1231 Encounter for screening mammogram for malignant neoplasm of breast: Secondary | ICD-10-CM

## 2018-01-22 ENCOUNTER — Ambulatory Visit
Admission: RE | Admit: 2018-01-22 | Discharge: 2018-01-22 | Disposition: A | Discharge status: Home / Self Care | Payer: Managed Care, Other (non HMO) | Source: Ambulatory Visit | Attending: Family Medicine | Admitting: Family Medicine

## 2018-01-22 DIAGNOSIS — Z1231 Encounter for screening mammogram for malignant neoplasm of breast: Secondary | ICD-10-CM | POA: Insufficient documentation

## 2018-12-12 ENCOUNTER — Other Ambulatory Visit: Payer: Self-pay | Admitting: Family Medicine

## 2018-12-12 DIAGNOSIS — Z1231 Encounter for screening mammogram for malignant neoplasm of breast: Secondary | ICD-10-CM

## 2019-01-28 ENCOUNTER — Ambulatory Visit
Admission: RE | Admit: 2019-01-28 | Discharge: 2019-01-28 | Disposition: A | Discharge status: Home / Self Care | Payer: Medicare HMO | Source: Ambulatory Visit | Attending: Family Medicine | Admitting: Family Medicine

## 2019-01-28 DIAGNOSIS — Z1231 Encounter for screening mammogram for malignant neoplasm of breast: Secondary | ICD-10-CM | POA: Insufficient documentation

## 2020-01-15 ENCOUNTER — Other Ambulatory Visit: Payer: Self-pay | Admitting: Family Medicine

## 2020-01-15 DIAGNOSIS — Z1231 Encounter for screening mammogram for malignant neoplasm of breast: Secondary | ICD-10-CM

## 2020-02-20 ENCOUNTER — Ambulatory Visit
Admission: RE | Admit: 2020-02-20 | Discharge: 2020-02-20 | Disposition: A | Discharge status: Home / Self Care | Payer: Medicare HMO | Source: Ambulatory Visit | Attending: Family Medicine | Admitting: Family Medicine

## 2020-02-20 ENCOUNTER — Other Ambulatory Visit: Payer: Self-pay

## 2020-02-20 DIAGNOSIS — Z1231 Encounter for screening mammogram for malignant neoplasm of breast: Secondary | ICD-10-CM | POA: Diagnosis present

## 2020-02-24 ENCOUNTER — Other Ambulatory Visit: Payer: Self-pay | Admitting: Family Medicine

## 2020-02-26 ENCOUNTER — Other Ambulatory Visit: Payer: Self-pay | Admitting: Family Medicine

## 2020-02-26 DIAGNOSIS — N6310 Unspecified lump in the right breast, unspecified quadrant: Secondary | ICD-10-CM

## 2020-02-26 DIAGNOSIS — R928 Other abnormal and inconclusive findings on diagnostic imaging of breast: Secondary | ICD-10-CM

## 2020-03-02 ENCOUNTER — Other Ambulatory Visit: Payer: Self-pay

## 2020-03-02 ENCOUNTER — Ambulatory Visit
Admission: RE | Admit: 2020-03-02 | Discharge: 2020-03-02 | Disposition: A | Discharge status: Home / Self Care | Payer: Medicare HMO | Source: Ambulatory Visit | Attending: Family Medicine | Admitting: Family Medicine

## 2020-03-02 DIAGNOSIS — N6310 Unspecified lump in the right breast, unspecified quadrant: Secondary | ICD-10-CM | POA: Diagnosis present

## 2020-03-02 DIAGNOSIS — R928 Other abnormal and inconclusive findings on diagnostic imaging of breast: Secondary | ICD-10-CM | POA: Insufficient documentation

## 2020-03-09 ENCOUNTER — Other Ambulatory Visit: Payer: Self-pay | Admitting: Family Medicine

## 2020-03-09 DIAGNOSIS — N6310 Unspecified lump in the right breast, unspecified quadrant: Secondary | ICD-10-CM

## 2020-03-09 DIAGNOSIS — R928 Other abnormal and inconclusive findings on diagnostic imaging of breast: Secondary | ICD-10-CM

## 2020-03-11 ENCOUNTER — Ambulatory Visit
Admission: RE | Admit: 2020-03-11 | Discharge: 2020-03-11 | Disposition: A | Discharge status: Home / Self Care | Payer: Medicare HMO | Source: Ambulatory Visit | Attending: Family Medicine | Admitting: Family Medicine

## 2020-03-11 ENCOUNTER — Other Ambulatory Visit: Payer: Self-pay

## 2020-03-11 DIAGNOSIS — N6310 Unspecified lump in the right breast, unspecified quadrant: Secondary | ICD-10-CM | POA: Diagnosis present

## 2020-03-11 DIAGNOSIS — R928 Other abnormal and inconclusive findings on diagnostic imaging of breast: Secondary | ICD-10-CM | POA: Insufficient documentation

## 2020-03-11 HISTORY — PX: BREAST BIOPSY: SHX20

## 2020-03-16 LAB — SURGICAL PATHOLOGY

## 2021-01-20 ENCOUNTER — Other Ambulatory Visit: Payer: Self-pay | Admitting: Family Medicine

## 2021-01-20 DIAGNOSIS — Z1231 Encounter for screening mammogram for malignant neoplasm of breast: Secondary | ICD-10-CM

## 2021-02-22 ENCOUNTER — Other Ambulatory Visit: Payer: Self-pay

## 2021-02-22 ENCOUNTER — Ambulatory Visit
Admission: RE | Admit: 2021-02-22 | Discharge: 2021-02-22 | Disposition: A | Discharge status: Home / Self Care | Payer: Medicare HMO | Source: Ambulatory Visit | Attending: Family Medicine | Admitting: Family Medicine

## 2021-02-22 DIAGNOSIS — Z1231 Encounter for screening mammogram for malignant neoplasm of breast: Secondary | ICD-10-CM | POA: Diagnosis not present

## 2022-01-26 ENCOUNTER — Other Ambulatory Visit: Payer: Self-pay | Admitting: Family Medicine

## 2022-01-26 DIAGNOSIS — Z1231 Encounter for screening mammogram for malignant neoplasm of breast: Secondary | ICD-10-CM

## 2022-02-23 ENCOUNTER — Ambulatory Visit
Admission: RE | Admit: 2022-02-23 | Discharge: 2022-02-23 | Disposition: A | Discharge status: Home / Self Care | Payer: Medicare HMO | Source: Ambulatory Visit | Attending: Family Medicine | Admitting: Family Medicine

## 2022-02-23 DIAGNOSIS — Z1231 Encounter for screening mammogram for malignant neoplasm of breast: Secondary | ICD-10-CM | POA: Diagnosis present

## 2023-01-11 ENCOUNTER — Other Ambulatory Visit: Payer: Self-pay | Admitting: Family Medicine

## 2023-01-11 DIAGNOSIS — Z1231 Encounter for screening mammogram for malignant neoplasm of breast: Secondary | ICD-10-CM

## 2023-02-27 ENCOUNTER — Ambulatory Visit
Admission: RE | Admit: 2023-02-27 | Discharge: 2023-02-27 | Disposition: A | Discharge status: Home / Self Care | Payer: Medicare HMO | Source: Ambulatory Visit | Attending: Family Medicine | Admitting: Family Medicine

## 2023-02-27 DIAGNOSIS — Z1231 Encounter for screening mammogram for malignant neoplasm of breast: Secondary | ICD-10-CM | POA: Insufficient documentation

## 2024-01-14 ENCOUNTER — Other Ambulatory Visit: Payer: Self-pay | Admitting: Family Medicine

## 2024-01-14 DIAGNOSIS — Z1231 Encounter for screening mammogram for malignant neoplasm of breast: Secondary | ICD-10-CM

## 2024-02-28 ENCOUNTER — Inpatient Hospital Stay: Admission: RE | Admit: 2024-02-28 | Discharge: 2024-02-28 | Attending: Family Medicine | Admitting: Family Medicine

## 2024-02-28 DIAGNOSIS — Z1231 Encounter for screening mammogram for malignant neoplasm of breast: Secondary | ICD-10-CM | POA: Diagnosis present
